# Patient Record
Sex: Male | Born: 1995 | Race: Black or African American | Hispanic: No | Marital: Single
Health system: Southern US, Community
[De-identification: ages and names within clinical notes are randomized; demographics above are authoritative.]

---
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
## 2017-04-11 DIAGNOSIS — R109 Unspecified abdominal pain: Secondary | ICD-10-CM | POA: Diagnosis not present

## 2017-04-13 ENCOUNTER — Other Ambulatory Visit: Payer: Self-pay | Admitting: Gastroenterology

## 2017-04-13 ENCOUNTER — Ambulatory Visit (HOSPITAL_COMMUNITY)
Admission: RE | Admit: 2017-04-13 | Discharge: 2017-04-13 | Disposition: A | Discharge status: Home / Self Care | Payer: 59 | Source: Ambulatory Visit | Attending: Gastroenterology | Admitting: Gastroenterology

## 2017-04-13 ENCOUNTER — Encounter (HOSPITAL_COMMUNITY): Payer: Self-pay

## 2017-04-13 DIAGNOSIS — R1084 Generalized abdominal pain: Secondary | ICD-10-CM

## 2017-04-13 DIAGNOSIS — R194 Change in bowel habit: Secondary | ICD-10-CM | POA: Diagnosis not present

## 2017-04-13 DIAGNOSIS — R109 Unspecified abdominal pain: Secondary | ICD-10-CM | POA: Diagnosis not present

## 2017-04-13 DIAGNOSIS — R1011 Right upper quadrant pain: Secondary | ICD-10-CM

## 2017-04-13 DIAGNOSIS — R1033 Periumbilical pain: Secondary | ICD-10-CM | POA: Diagnosis not present

## 2017-04-13 MED ORDER — IOPAMIDOL (ISOVUE-300) INJECTION 61%
30.0000 mL | Freq: Once | INTRAVENOUS | Status: AC | PRN
  Administered 2017-04-13: 30 mL via ORAL

## 2017-04-13 MED ORDER — IOPAMIDOL (ISOVUE-300) INJECTION 61%
INTRAVENOUS | Status: AC
  Filled 2017-04-13: qty 30

## 2017-04-13 MED ORDER — IOPAMIDOL (ISOVUE-300) INJECTION 61%
INTRAVENOUS | Status: AC
  Filled 2017-04-13: qty 100

## 2017-04-13 MED ORDER — IOPAMIDOL (ISOVUE-300) INJECTION 61%
100.0000 mL | Freq: Once | INTRAVENOUS | Status: AC | PRN
  Administered 2017-04-13: 100 mL via INTRAVENOUS

## 2017-04-13 NOTE — Progress Notes (Signed)
Clarence Dunsmore MD 

## 2017-04-13 NOTE — Progress Notes (Signed)
William Gaugh MD 

## 2017-04-13 NOTE — Progress Notes (Signed)
William Dimino MD 

## 2017-04-30 ENCOUNTER — Ambulatory Visit (HOSPITAL_COMMUNITY)
Admission: RE | Admit: 2017-04-30 | Discharge: 2017-04-30 | Disposition: A | Discharge status: Home / Self Care | Payer: 59 | Source: Ambulatory Visit | Attending: Gastroenterology | Admitting: Gastroenterology

## 2017-04-30 ENCOUNTER — Ambulatory Visit (HOSPITAL_COMMUNITY): Admission: RE | Admit: 2017-04-30 | Payer: 59 | Source: Ambulatory Visit

## 2017-04-30 ENCOUNTER — Encounter (HOSPITAL_COMMUNITY): Payer: Self-pay

## 2017-04-30 DIAGNOSIS — R1033 Periumbilical pain: Secondary | ICD-10-CM | POA: Diagnosis not present

## 2017-04-30 DIAGNOSIS — R1011 Right upper quadrant pain: Secondary | ICD-10-CM

## 2017-05-07 ENCOUNTER — Encounter (HOSPITAL_COMMUNITY): Payer: 59

## 2017-05-07 ENCOUNTER — Encounter (HOSPITAL_COMMUNITY)
Admission: RE | Admit: 2017-05-07 | Discharge: 2017-05-07 | Disposition: A | Discharge status: Home / Self Care | Payer: 59 | Source: Ambulatory Visit | Attending: Gastroenterology | Admitting: Gastroenterology

## 2017-05-07 DIAGNOSIS — R1011 Right upper quadrant pain: Secondary | ICD-10-CM | POA: Insufficient documentation

## 2017-05-07 DIAGNOSIS — R109 Unspecified abdominal pain: Secondary | ICD-10-CM | POA: Diagnosis not present

## 2017-05-07 MED ORDER — TECHNETIUM TC 99M MEBROFENIN IV KIT
5.0000 | PACK | Freq: Once | INTRAVENOUS | Status: AC | PRN
  Administered 2017-05-07: 5 via INTRAVENOUS

## 2017-06-03 DIAGNOSIS — Z1211 Encounter for screening for malignant neoplasm of colon: Secondary | ICD-10-CM | POA: Diagnosis not present

## 2017-06-03 DIAGNOSIS — R152 Fecal urgency: Secondary | ICD-10-CM | POA: Diagnosis not present

## 2017-06-03 DIAGNOSIS — R194 Change in bowel habit: Secondary | ICD-10-CM | POA: Diagnosis not present

## 2017-06-03 DIAGNOSIS — R1033 Periumbilical pain: Secondary | ICD-10-CM | POA: Diagnosis not present

## 2017-06-04 MED FILL — GAVILYTE-G SOLUTION: 236 | 1 days supply | Qty: 4000 | Fill #0

## 2017-06-26 DIAGNOSIS — D122 Benign neoplasm of ascending colon: Secondary | ICD-10-CM | POA: Diagnosis not present

## 2017-06-26 DIAGNOSIS — K269 Duodenal ulcer, unspecified as acute or chronic, without hemorrhage or perforation: Secondary | ICD-10-CM | POA: Diagnosis not present

## 2017-06-26 DIAGNOSIS — K635 Polyp of colon: Secondary | ICD-10-CM | POA: Diagnosis not present

## 2017-06-26 DIAGNOSIS — R194 Change in bowel habit: Secondary | ICD-10-CM | POA: Diagnosis not present

## 2017-06-26 DIAGNOSIS — Z1211 Encounter for screening for malignant neoplasm of colon: Secondary | ICD-10-CM | POA: Diagnosis not present

## 2017-06-26 DIAGNOSIS — R1033 Periumbilical pain: Secondary | ICD-10-CM | POA: Diagnosis not present

## 2017-11-18 DIAGNOSIS — R109 Unspecified abdominal pain: Secondary | ICD-10-CM | POA: Diagnosis not present

## 2017-11-18 DIAGNOSIS — R1030 Lower abdominal pain, unspecified: Secondary | ICD-10-CM | POA: Diagnosis not present

## 2017-11-18 MED FILL — DICYCLOMINE 10 MG CAPSULE: 10 | 30 days supply | Qty: 90 | Fill #0

## 2018-01-08 DIAGNOSIS — R369 Urethral discharge, unspecified: Secondary | ICD-10-CM | POA: Diagnosis not present

## 2018-01-08 DIAGNOSIS — R300 Dysuria: Secondary | ICD-10-CM | POA: Diagnosis not present

## 2018-01-11 DIAGNOSIS — R1030 Lower abdominal pain, unspecified: Secondary | ICD-10-CM | POA: Diagnosis not present

## 2018-01-11 MED FILL — CLENPIQ 10-3.5-12 MG-GM -GM: 10-3.5-12 M | 1 days supply | Qty: 320 | Fill #0

## 2018-01-14 DIAGNOSIS — K640 First degree hemorrhoids: Secondary | ICD-10-CM | POA: Diagnosis not present

## 2018-01-14 DIAGNOSIS — K6289 Other specified diseases of anus and rectum: Secondary | ICD-10-CM | POA: Diagnosis not present

## 2018-01-14 DIAGNOSIS — R109 Unspecified abdominal pain: Secondary | ICD-10-CM | POA: Diagnosis not present

## 2018-01-14 MED FILL — GLYCOPYRROLATE 1 MG TABLET: 1 | 30 days supply | Qty: 60 | Fill #0

## 2018-02-01 DIAGNOSIS — N342 Other urethritis: Secondary | ICD-10-CM | POA: Diagnosis not present

## 2018-02-01 MED FILL — DOXYCYCLINE HYCLATE 100 MG: 100 | 7 days supply | Qty: 14 | Fill #0

## 2018-02-22 DIAGNOSIS — R197 Diarrhea, unspecified: Secondary | ICD-10-CM | POA: Diagnosis not present

## 2018-02-22 DIAGNOSIS — R109 Unspecified abdominal pain: Secondary | ICD-10-CM | POA: Diagnosis not present

## 2018-02-22 MED FILL — GLYCOPYRROLATE 1 MG TABLET: 1 | 30 days supply | Qty: 60 | Fill #0

## 2019-01-29 DIAGNOSIS — Z113 Encounter for screening for infections with a predominantly sexual mode of transmission: Secondary | ICD-10-CM | POA: Diagnosis not present

## 2019-02-16 DIAGNOSIS — K529 Noninfective gastroenteritis and colitis, unspecified: Secondary | ICD-10-CM | POA: Diagnosis not present

## 2019-02-16 DIAGNOSIS — R1084 Generalized abdominal pain: Secondary | ICD-10-CM | POA: Diagnosis not present

## 2019-04-07 IMAGING — CT CT ABD-PELV W/ CM
2 of 4 series · 16 of 46 positions shown, 18 images · IV contrast (ISOVUE 300)
Comparison: None.

CLINICAL DATA: Upper abdominal pain for 2 years.

EXAM:
CT ABDOMEN AND PELVIS WITH CONTRAST
TECHNIQUE: Multidetector CT imaging of the abdomen and pelvis was performed
using the standard protocol following bolus administration of
intravenous contrast.
CONTRAST:  30mL QHLSK2-XNN IOPAMIDOL (QHLSK2-XNN) INJECTION 61%,
100mL QHLSK2-XNN IOPAMIDOL (QHLSK2-XNN) INJECTION 61%

[Series 2: axial st · axial · 0.62mm/px · z∈[-479,-114]mm · 13 of 83 slices shown, 15 images]
[im 5/83  soft-tissue]
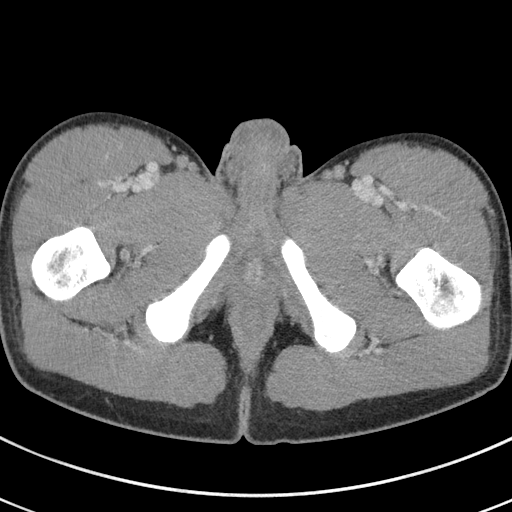
[im 5/83  bone]
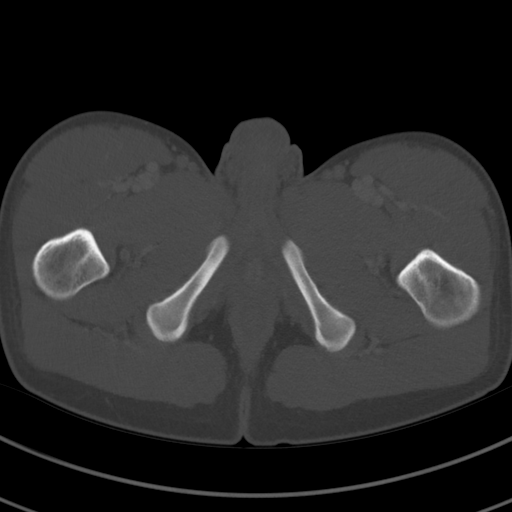
[im 10/83  soft-tissue]
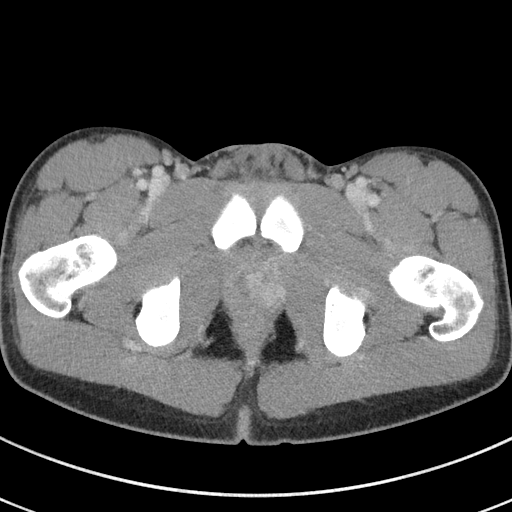
[im 19/83  soft-tissue]
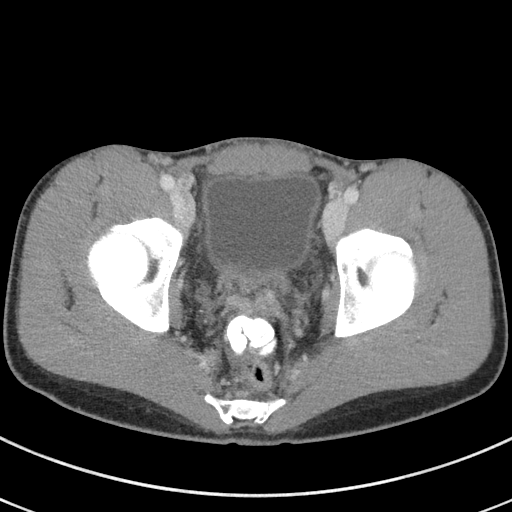
[im 23/83  soft-tissue]
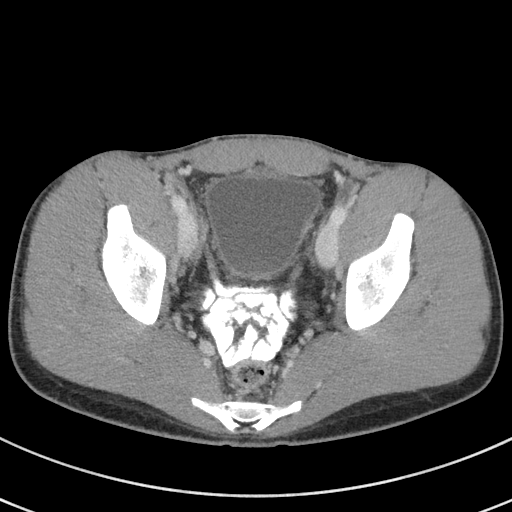
[im 28/83  soft-tissue]
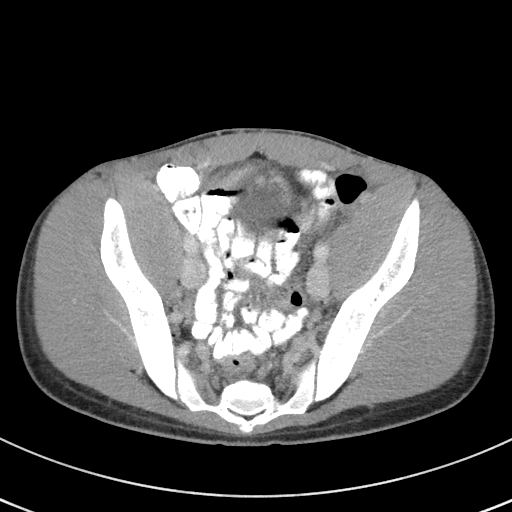
[im 37/83  soft-tissue]
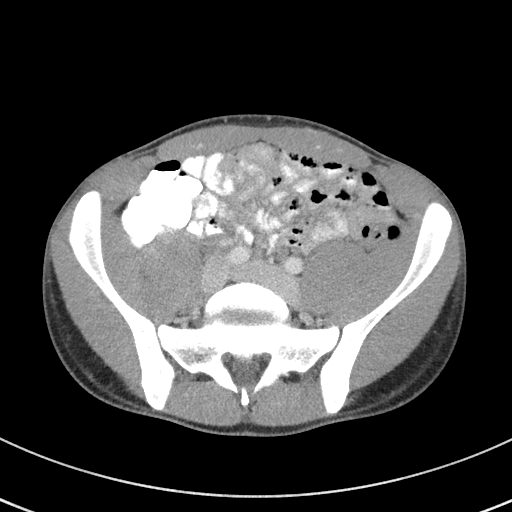
[im 42/83  soft-tissue]
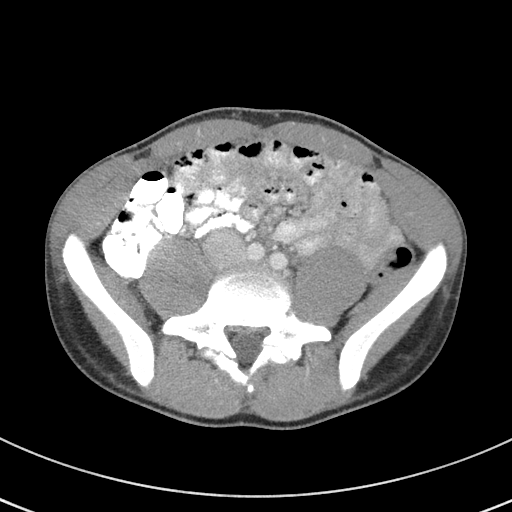
[im 46/83  soft-tissue]
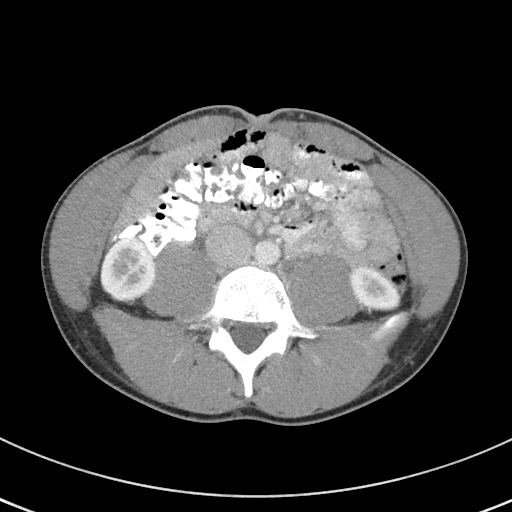
[im 55/83  soft-tissue]
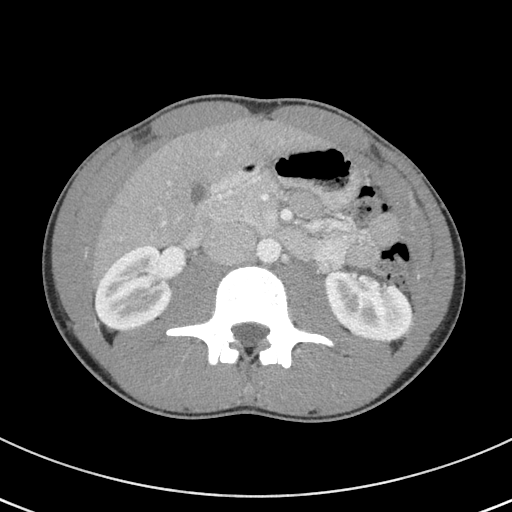
[im 55/83  bone]
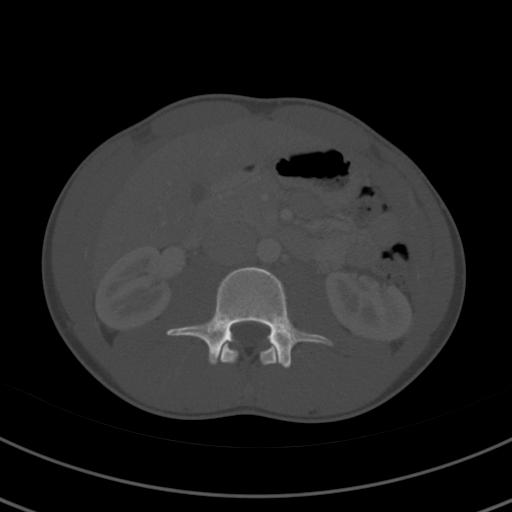
[im 60/83  soft-tissue]
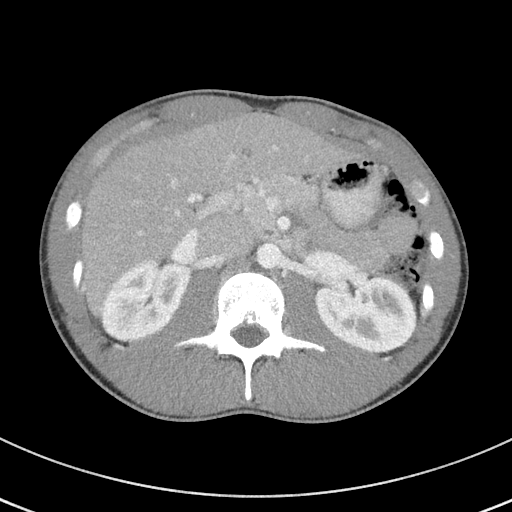
[im 64/83  soft-tissue]
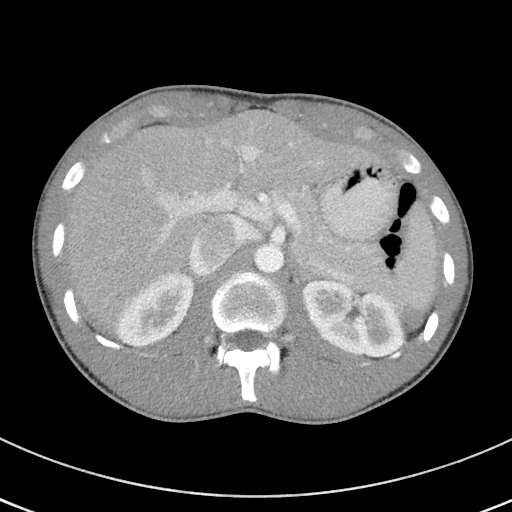
[im 73/83  soft-tissue]
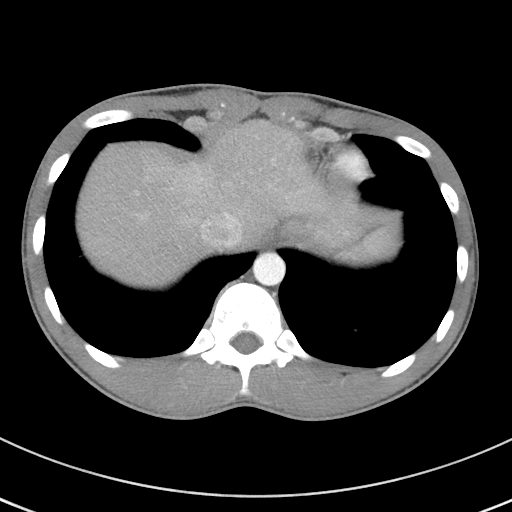
[im 78/83  soft-tissue]
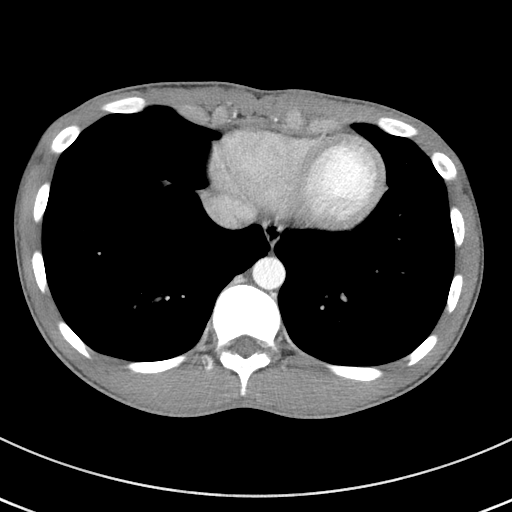

[Series 5: coronal st · coronal · 0.67mm/px · 3 of 84 slices shown]
[im 28/84  soft-tissue]
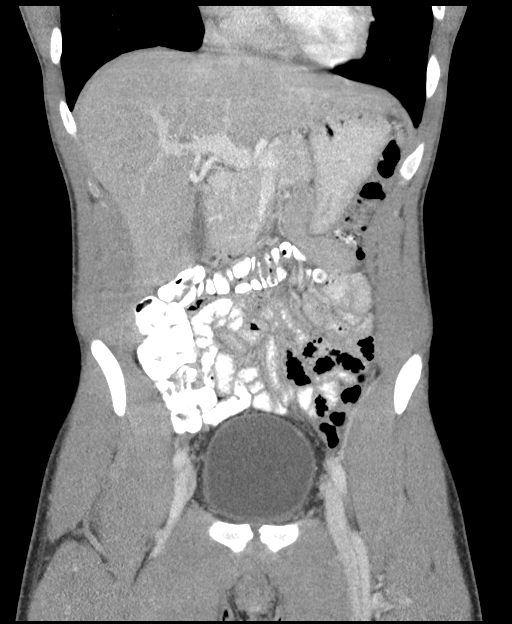
[im 37/84  soft-tissue]
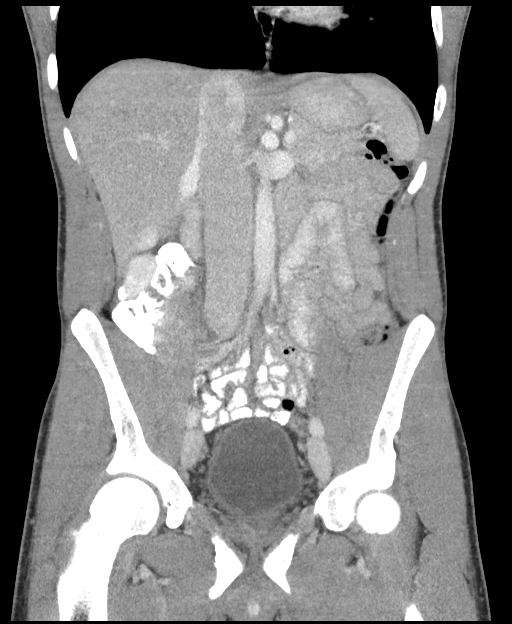
[im 47/84  soft-tissue]
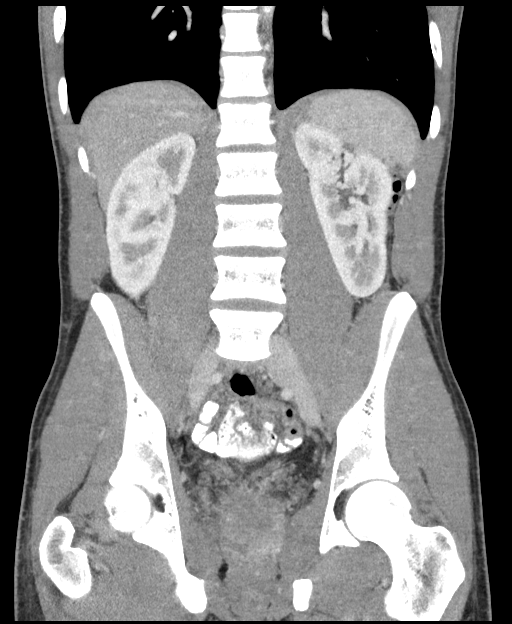

[16 of 46 positions shown; findings below may reference images not displayed]

FINDINGS: Lower chest: No acute abnormality.

Hepatobiliary: No focal liver abnormality is seen. No gallstones,
gallbladder wall thickening, or biliary dilatation.

Pancreas: Unremarkable. No pancreatic ductal dilatation or
surrounding inflammatory changes.

Spleen: Normal in size without focal abnormality.

Adrenals/Urinary Tract: Normal adrenal glands. Normal kidneys. No
urolithiasis or renal mass. Normal bladder.

Stomach/Bowel: Stomach is within normal limits. Appendix appears
normal. No evidence of bowel wall thickening, distention, or
inflammatory changes.

Vascular/Lymphatic: No significant vascular findings are present. No
enlarged abdominal or pelvic lymph nodes.

Reproductive: Prostate is unremarkable.

Other: No abdominal wall hernia or abnormality. No abdominopelvic
ascites.

Musculoskeletal: No acute or significant osseous findings. Bilateral
L5 pars interarticularis defects.
IMPRESSION: No acute abdominal or pelvic pathology.

## 2019-12-20 DIAGNOSIS — R300 Dysuria: Secondary | ICD-10-CM | POA: Diagnosis not present

## 2019-12-20 DIAGNOSIS — Z113 Encounter for screening for infections with a predominantly sexual mode of transmission: Secondary | ICD-10-CM | POA: Diagnosis not present

## 2020-07-02 DIAGNOSIS — Z113 Encounter for screening for infections with a predominantly sexual mode of transmission: Secondary | ICD-10-CM | POA: Diagnosis not present

## 2022-02-15 DIAGNOSIS — H52223 Regular astigmatism, bilateral: Secondary | ICD-10-CM | POA: Diagnosis not present

## 2022-08-26 ENCOUNTER — Other Ambulatory Visit (HOSPITAL_COMMUNITY): Payer: Self-pay

## 2022-08-26 MED ORDER — ACETAMINOPHEN-CODEINE 300-30 MG PO TABS
1.0000 | ORAL_TABLET | Freq: Four times a day (QID) | ORAL | 0 refills | Status: AC | PRN
  Filled 2022-08-26: qty 15, 4d supply, fill #0
